# Patient Record
Sex: Male | Born: 1985 | Race: Black or African American | Hispanic: No | Marital: Single | Smoking: Former smoker
Health system: Southern US, Community
[De-identification: ages and names within clinical notes are randomized; demographics above are authoritative.]

## PROBLEM LIST (undated history)

## (undated) DIAGNOSIS — F419 Anxiety disorder, unspecified: Secondary | ICD-10-CM

## (undated) DIAGNOSIS — I10 Essential (primary) hypertension: Secondary | ICD-10-CM

## (undated) DIAGNOSIS — T7840XA Allergy, unspecified, initial encounter: Secondary | ICD-10-CM

## (undated) DIAGNOSIS — J45909 Unspecified asthma, uncomplicated: Secondary | ICD-10-CM

## (undated) DIAGNOSIS — K219 Gastro-esophageal reflux disease without esophagitis: Secondary | ICD-10-CM

## (undated) DIAGNOSIS — R011 Cardiac murmur, unspecified: Secondary | ICD-10-CM

## (undated) HISTORY — DX: Gastro-esophageal reflux disease without esophagitis: K21.9

## (undated) HISTORY — DX: Unspecified asthma, uncomplicated: J45.909

## (undated) HISTORY — DX: Anxiety disorder, unspecified: F41.9

## (undated) HISTORY — DX: Essential (primary) hypertension: I10

## (undated) HISTORY — DX: Allergy, unspecified, initial encounter: T78.40XA

## (undated) HISTORY — DX: Cardiac murmur, unspecified: R01.1

---

## 2021-11-04 ENCOUNTER — Emergency Department: Payer: Self-pay

## 2021-11-04 ENCOUNTER — Encounter: Payer: Self-pay | Admitting: Emergency Medicine

## 2021-11-04 ENCOUNTER — Emergency Department
Admission: EM | Admit: 2021-11-04 | Discharge: 2021-11-04 | Disposition: A | Discharge status: Home / Self Care | Payer: Self-pay | Attending: Emergency Medicine | Admitting: Emergency Medicine

## 2021-11-04 ENCOUNTER — Other Ambulatory Visit: Payer: Self-pay

## 2021-11-04 DIAGNOSIS — R1110 Vomiting, unspecified: Secondary | ICD-10-CM | POA: Insufficient documentation

## 2021-11-04 DIAGNOSIS — R079 Chest pain, unspecified: Secondary | ICD-10-CM | POA: Insufficient documentation

## 2021-11-04 DIAGNOSIS — R1115 Cyclical vomiting syndrome unrelated to migraine: Secondary | ICD-10-CM

## 2021-11-04 LAB — BASIC METABOLIC PANEL
Anion gap: 7 (ref 5–15)
BUN: 15 mg/dL (ref 6–20)
CO2: 25 mmol/L (ref 22–32)
Calcium: 9.2 mg/dL (ref 8.9–10.3)
Chloride: 104 mmol/L (ref 98–111)
Creatinine, Ser: 1.1 mg/dL (ref 0.61–1.24)
GFR, Estimated: >60 mL/min (ref >60)
Glucose, Bld: 114 mg/dL — ABNORMAL HIGH (ref 70–99)
Potassium: 3.9 mmol/L (ref 3.5–5.1)
Sodium: 136 mmol/L (ref 135–145)

## 2021-11-04 LAB — CBC
HCT: 46 % (ref 39.0–52.0)
Hemoglobin: 15.2 g/dL (ref 13.0–17.0)
MCH: 29.9 pg (ref 26.0–34.0)
MCHC: 33 g/dL (ref 30.0–36.0)
MCV: 90.6 fL (ref 80.0–100.0)
Platelets: 283 10*3/uL (ref 150–400)
RBC: 5.08 MIL/uL (ref 4.22–5.81)
RDW: 13.4 % (ref 11.5–15.5)
WBC: 8.4 10*3/uL (ref 4.0–10.5)
nRBC: 0 % (ref 0.0–0.2)

## 2021-11-04 LAB — TROPONIN I (HIGH SENSITIVITY): Troponin I (High Sensitivity): 6 ng/L (ref <18)

## 2021-11-04 MED ORDER — ONDANSETRON 4 MG PO TBDP: 4.0000 mg | ORAL_TABLET | Freq: Three times a day (TID) | ORAL | 0 refills | Status: DC | PRN

## 2021-11-04 NOTE — Discharge Instructions (Addendum)
-  Please establish with a primary care provider in regards to the incidental finding on your chest x-ray, as well as for ongoing blood pressure management. ?-Follow-up with a gastroenterologist listed above, as discussed. ?-Return to the emergency department anytime if you begin to experience any new or worsening symptoms. ?

## 2021-11-04 NOTE — ED Provider Notes (Signed)
? ?[facility] ?Provider Note ? ? ? Event Date/Time  ? First MD Initiated Contact with Patient 11/04/21 1537   ?  (approximate) ? ? ?History  ? ?Chief Complaint ?Chest Pain ? ? ?HPI ?Zachary Golden is a 36 y.o. male, no remarkable medical history, presents to the emergency department for evaluation of chest pain.  Patient states that he has a 2 to 3-year history of having weekly episodes of emesis that occurs as soon as he wakes up.  Following these episodes of emesis, he typically has residual chest pain afterwards.  He states that there is nothing new that happened today, however he is convinced by his partner to come get evaluated in the emergency department for this.  He has not previously been evaluated for this.  Denies fever/chills, abdominal pain, flank pain, diarrhea, urinary symptoms, lightheadedness/dizziness, or rashes/lesions. ? ?History Limitations: No limitations ? ?    ? ? ?Physical Exam  ?Triage Vital Signs: ?ED Triage Vitals  ?Enc Vitals Group  ?   BP 11/04/21 1324 (!) 177/131  ?   Pulse Rate 11/04/21 1324 73  ?   Resp 11/04/21 1324 18  ?   Temp 11/04/21 1324 98.1 ?F (36.7 ?C)  ?   Temp Source 11/04/21 1324 Oral  ?   SpO2 11/04/21 1324 95 %  ?   Weight 11/04/21 1323 245 lb (111.1 kg)  ?   Height 11/04/21 1323 5\' 9"  (1.753 m)  ?   Head Circumference --   ?   Peak Flow --   ?   Pain Score 11/04/21 1323 8  ?   Pain Loc --   ?   Pain Edu? --   ?   Excl. in [place]? --   ? ? ?Most recent vital signs: ?Vitals:  ? 11/04/21 1556 11/04/21 1615  ?BP: (!) 168/118 (!) 160/100  ?Pulse: 70 70  ?Resp: 18 18  ?Temp:    ?SpO2: 96% 97%  ? ? ?General: Awake, NAD.  ?Skin: Warm, dry. No rashes or lesions.  ?Eyes: PERRL. Conjunctivae normal.  ?Neck: Normal ROM. No nuchal rigidity.  ?CV: Good peripheral perfusion.  ?Resp: Normal effort.  Lung sounds are clear bilaterally. ?Abd: Soft, non-tender. No distention.  ?Neuro: At baseline. No gross neurological deficits.  ?MSK: No gross deformities. Normal ROM in  all extremities.  ? ? ?Physical Exam ? ? ? ?ED Results / Procedures / Treatments  ?Labs ?(all labs ordered are listed, but only abnormal results are displayed) ?Labs Reviewed  ?BASIC METABOLIC PANEL - Abnormal; Notable for the following components:  ?    Result Value  ? Glucose, Bld 114 (*)   ? All other components within normal limits  ?CBC  ?TROPONIN I (HIGH SENSITIVITY)  ? ? ? ?EKG ?Sinus rhythm, rate of 74, nonspecific ST segment changes, no axis deviation, no AV blocks, normal QRS interval. ? ? ?RADIOLOGY ? ?ED Provider Interpretation: I personally reviewed this chest x-ray, small density appreciated in the right midlung patient my interpretation ? ?DG Chest 2 View ? ?Result Date: 11/04/2021 ?CLINICAL DATA:  Left-sided chest pain and nausea. EXAM: CHEST - 2 VIEW COMPARISON:  None. FINDINGS: The heart size and mediastinal contours are within normal limits. Normal pulmonary vascularity. 1.4 cm nodular density in the peripheral right mid lung. No focal consolidation, pleural effusion, or pneumothorax. No acute osseous abnormality. IMPRESSION: 1.  No active cardiopulmonary disease. 2. 1.4 cm nodular density in the peripheral right mid lung. Recommend further evaluation with outpatient non-emergent  chest CT without contrast. Electronically Signed   By: Titus Dubin M.D.   On: 11/04/2021 13:51   ? ?PROCEDURES: ? ?Critical Care performed: None. ? ?Procedures ? ? ? ?MEDICATIONS ORDERED IN ED: ?Medications - No data to display ? ? ?IMPRESSION / MDM / ASSESSMENT AND PLAN / ED COURSE  ?I reviewed the triage vital signs and the nursing notes. ?             ?               ? ?Differential diagnosis includes, but is not limited to, ACS, myocarditis/pericarditis, gastroenteritis, hypertension, esophagitis. ? ?ED Course ?Patient appears well.  Notably hypertensive at 160/100, otherwise normal vitals.  NAD.  Not requesting any pain medication at this time. ? ?CBC shows no leukocytosis or anemia.  BMP shows no electrolyte  abnormalities or kidney injury. ? ?Initial EKG unremarkable.  Initial troponin 6.  Unlikely ACS or myocarditis/pericarditis. ? ?Assessment/Plan ?Patient presents with chest pain that occurs following episodes of emesis that he has been having chronically for the past 2 to 3 years.  He has not previously been evaluated for this.  Lab work-up reassuring for no evidence of cardiac pathology.  Chest x-ray is clear, though does note a nodular density in the right midlung.  Notified the patient of this finding and advised him that he will need repeat imaging may be scheduled through his primary care provider.  Additionally spoke to him about his high blood pressure, which additionally needs to be managed through his PCP.  In regards to his weekly episodes of emesis, unclear etiology at this time.  We will provide him a referral to gastroenterology for further evaluation.  We will provide her with a prescription for ondansetron.  We will plan to discharge. ? ?Considered admission for this patient, but given the patient's stable presentation, and unremarkable work-up, he is unlikely to benefit. ? ?Provided the patient with anticipatory guidance, return precautions, and educational material. Encouraged the patient to return to the emergency department at any time if they begin to experience any new or worsening symptoms. Patient expressed understanding and agreed with the plan.  ? ?  ? ? ?FINAL CLINICAL IMPRESSION(S) / ED DIAGNOSES  ? ?Final diagnoses:  ?Nonspecific chest pain  ?Emesis, persistent  ? ? ? ?Rx / DC Orders  ? ?ED Discharge Orders   ? ?      Ordered  ?  ondansetron (ZOFRAN-ODT) 4 MG disintegrating tablet  Every 8 hours PRN,   Status:  Discontinued       ? 11/04/21 1547  ?  ondansetron (ZOFRAN-ODT) 4 MG disintegrating tablet  Every 8 hours PRN       ? 11/04/21 1611  ? ?  ?  ? ?  ? ? ? ?Note:  This document was prepared using Dragon voice recognition software and may include unintentional dictation errors. ?   ?Teodoro Spray, [place] ?11/04/21 2354 ? ?  ?Lavonia Drafts, MD ?11/10/21 [phone] ? ?

## 2021-11-04 NOTE — ED Triage Notes (Signed)
Pt here with cp that started at 0630. Pt states pain is left sided and radiates to his neck. Pt denies diarrhea but has nausea and 3 episodes of emesis. Pt is here from [facility]. ?

## 2023-04-06 IMAGING — CR DG CHEST 2V
1 series · 2 of 2 positions shown · non-contrast
Comparison: None.

CLINICAL DATA: Left-sided chest pain and nausea.

EXAM:
CHEST - 2 VIEW

[Series 1: dg chest 2 view · 0.14mm/px · 2 of 2 slices shown]
[im 1/2]
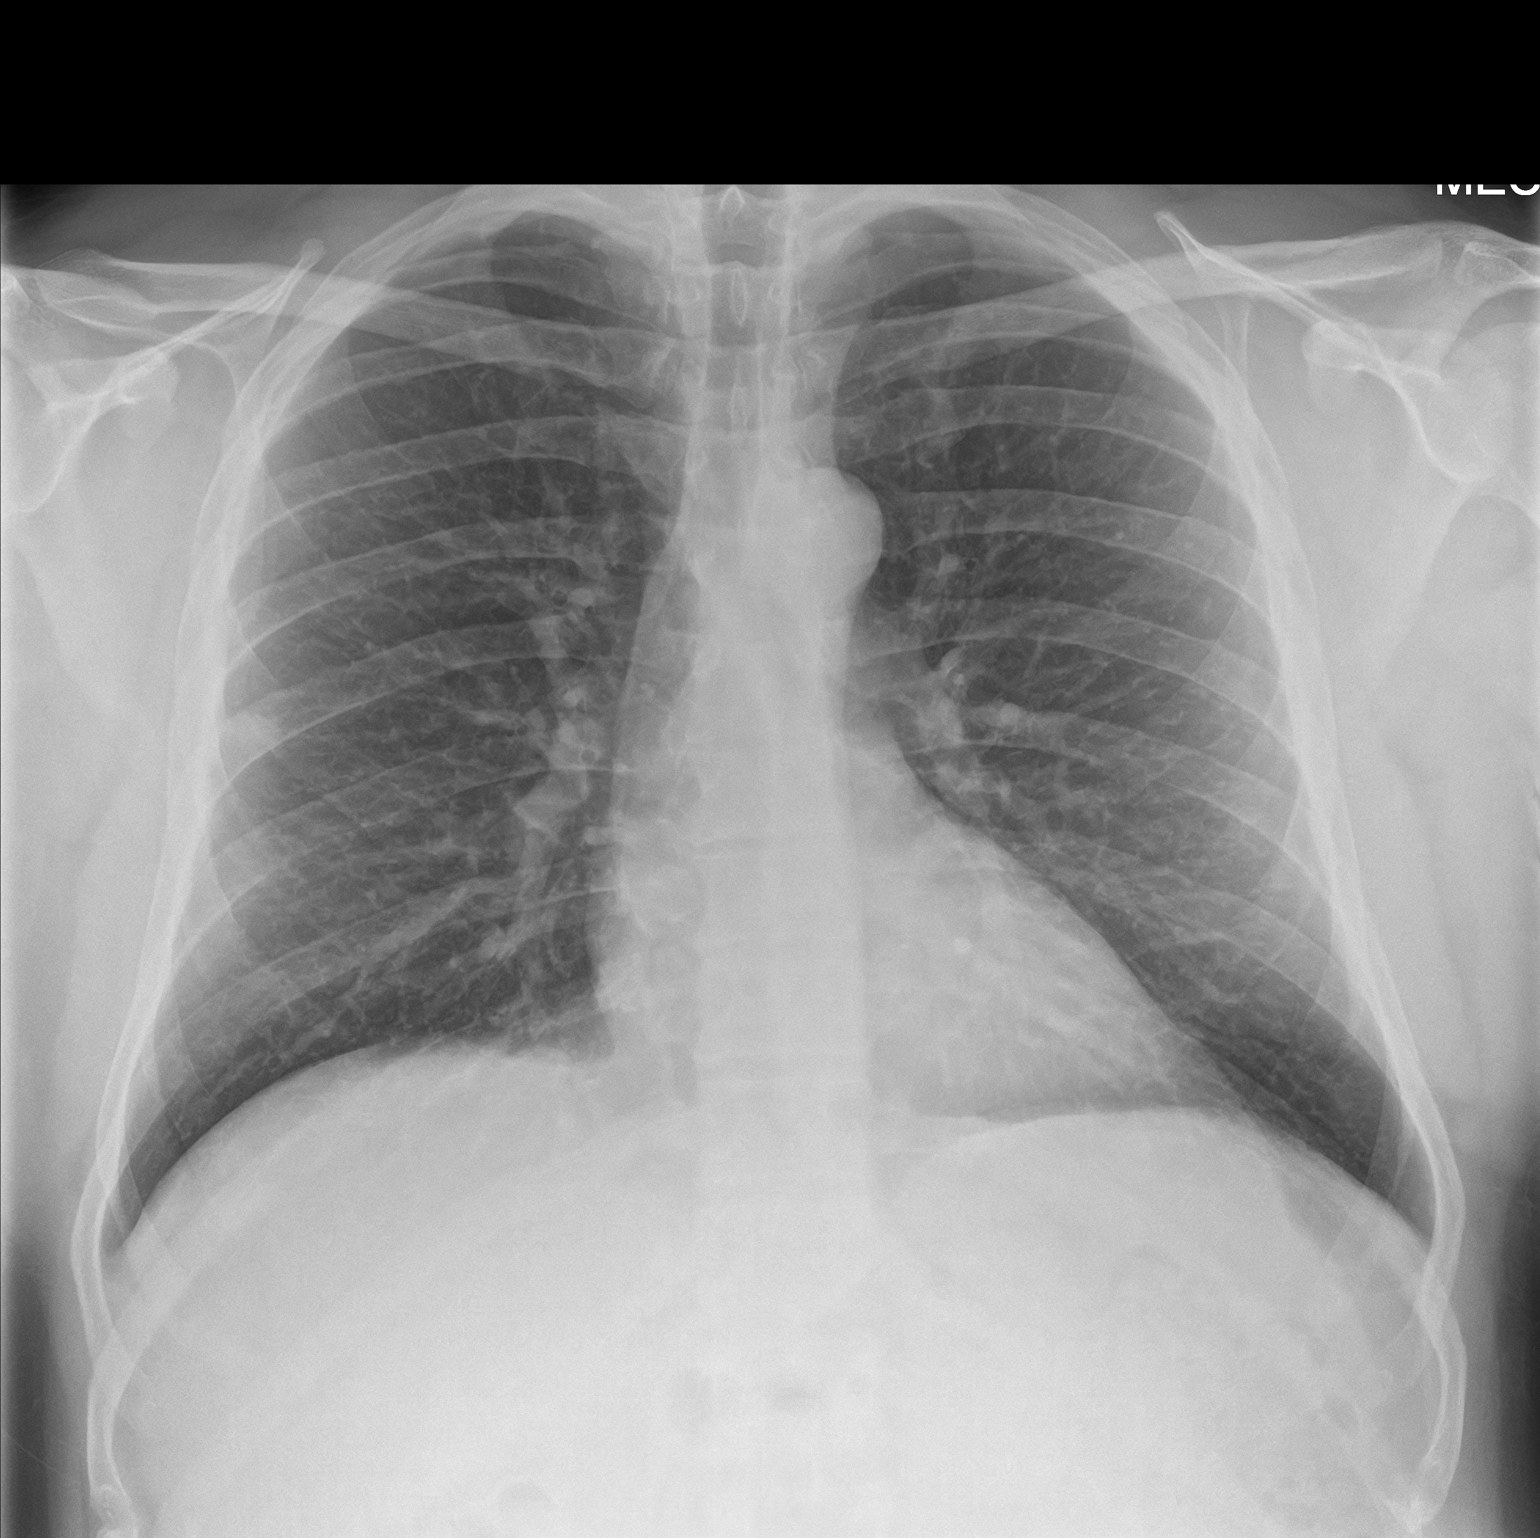
[im 2/2]
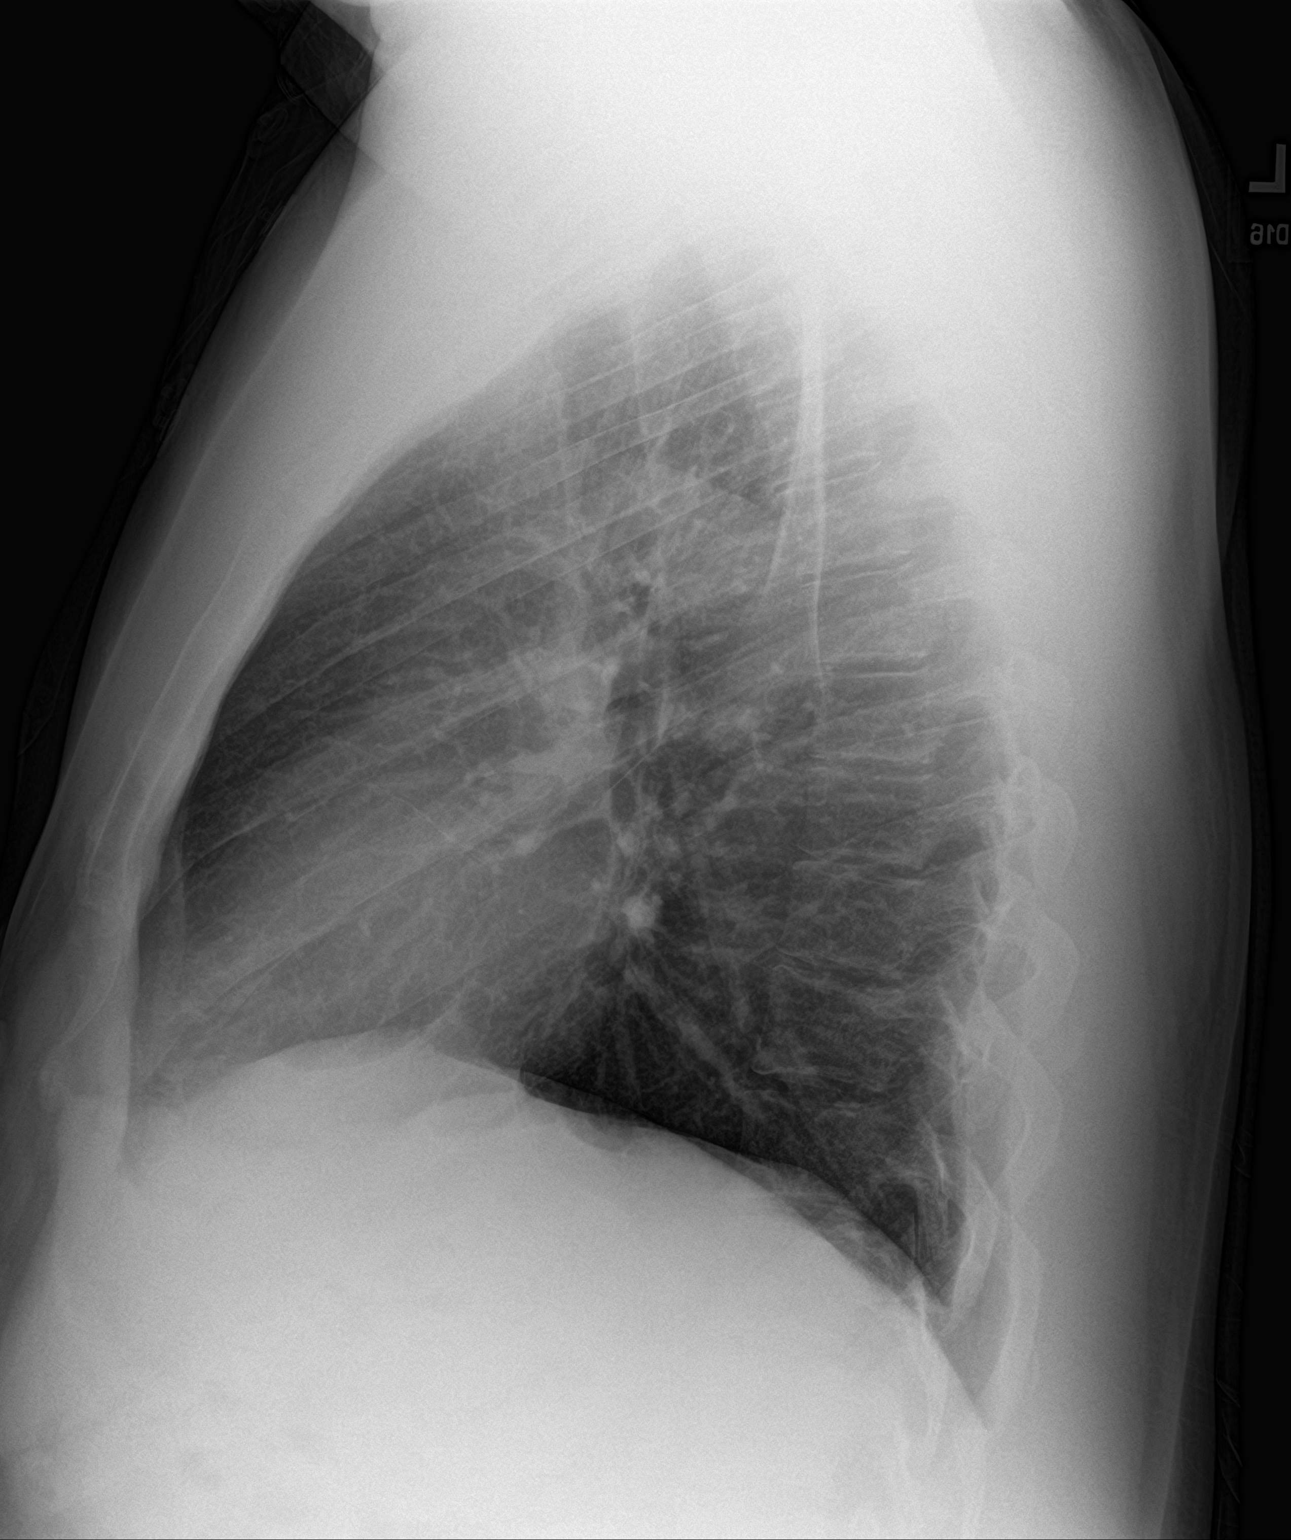

[2 of 2 positions shown; findings below may reference images not displayed]

FINDINGS: The heart size and mediastinal contours are within normal limits.
Normal pulmonary vascularity. 1.4 cm nodular density in the
peripheral right mid lung. No focal consolidation, pleural effusion,
or pneumothorax. No acute osseous abnormality.
IMPRESSION: 1.  No active cardiopulmonary disease.
2. 1.4 cm nodular density in the peripheral right mid lung.
Recommend further evaluation with outpatient non-emergent chest CT
without contrast.

## 2023-10-04 ENCOUNTER — Ambulatory Visit: Payer: Self-pay | Admitting: Family Medicine

## 2023-10-27 ENCOUNTER — Encounter: Payer: Self-pay | Admitting: Family Medicine

## 2023-10-27 ENCOUNTER — Ambulatory Visit (INDEPENDENT_AMBULATORY_CARE_PROVIDER_SITE_OTHER): Payer: Medicaid Other | Admitting: Family Medicine

## 2023-10-27 VITALS — BP 154/116 | HR 76 | Ht 69.0 in | Wt 262.0 lb

## 2023-10-27 DIAGNOSIS — J301 Allergic rhinitis due to pollen: Secondary | ICD-10-CM | POA: Diagnosis not present

## 2023-10-27 DIAGNOSIS — J45909 Unspecified asthma, uncomplicated: Secondary | ICD-10-CM

## 2023-10-27 DIAGNOSIS — Z114 Encounter for screening for human immunodeficiency virus [HIV]: Secondary | ICD-10-CM

## 2023-10-27 DIAGNOSIS — Z23 Encounter for immunization: Secondary | ICD-10-CM

## 2023-10-27 DIAGNOSIS — I10 Essential (primary) hypertension: Secondary | ICD-10-CM

## 2023-10-27 DIAGNOSIS — J45990 Exercise induced bronchospasm: Secondary | ICD-10-CM

## 2023-10-27 DIAGNOSIS — Z6838 Body mass index (BMI) 38.0-38.9, adult: Secondary | ICD-10-CM

## 2023-10-27 DIAGNOSIS — Z1159 Encounter for screening for other viral diseases: Secondary | ICD-10-CM

## 2023-10-27 DIAGNOSIS — Z130 Encounter for screening for diseases of the blood and blood-forming organs and certain disorders involving the immune mechanism: Secondary | ICD-10-CM

## 2023-10-27 DIAGNOSIS — Z13220 Encounter for screening for lipoid disorders: Secondary | ICD-10-CM

## 2023-10-27 DIAGNOSIS — R911 Solitary pulmonary nodule: Secondary | ICD-10-CM | POA: Diagnosis not present

## 2023-10-27 DIAGNOSIS — Z7689 Persons encountering health services in other specified circumstances: Secondary | ICD-10-CM

## 2023-10-27 MED ORDER — LOSARTAN POTASSIUM 25 MG PO TABS: 25.0000 mg | ORAL_TABLET | Freq: Every day | ORAL | 0 refills | Status: DC

## 2023-10-27 MED ORDER — HYDROCHLOROTHIAZIDE 12.5 MG PO CAPS: 12.5000 mg | ORAL_CAPSULE | Freq: Every day | ORAL | 0 refills | Status: DC

## 2023-10-27 NOTE — Progress Notes (Signed)
 New patient visit   Patient: Zachary Golden   DOB: 1986/06/10   37 y.o. Male  MRN: [id number] Visit Date: 10/27/2023  Today's healthcare provider: Sherlyn Hay, DO   Chief Complaint  Patient presents with   New Patient (Initial Visit)    Patient is present to establish care with a provider due to morning sickness after several lifestyle changes for a few years now.  Labs last completed last year.    Care Management    Pneumococcal Vaccine  HIV Screening  Hepatitis C Screening    Subjective    Zachary Golden is a 38 y.o. male who presents today as a new patient to establish care.  HPI HPI     New Patient (Initial Visit)    Additional comments: Patient is present to establish care with a provider due to morning sickness after several lifestyle changes for a few years now.  Labs last completed last year.         Care Management    Additional comments: Pneumococcal Vaccine  HIV Screening  Hepatitis C Screening       Last edited by Acey Lav, CMA on 10/27/2023  1:57 PM.      Zachary Golden is a 38 year old male with hypertension who presents with concerns about high blood pressure management.  He has a history of consistently high blood pressure readings, which he believes may have worsened with weight gain after the age of 80. He has attempted lifestyle modifications such as reducing salt intake. He recalls an ER visit in 2023 due to high blood pressure, where a chest x-ray revealed a lung nodule.  Follow-up imaging was recommended but he reports he has not been able to do this.  He has not been on regular antihypertensive medication and is interested in exploring treatment options.  He has a history of asthma, previously managed with Advair and a 'little square pill' during high school. Currently, he uses albuterol as a rescue inhaler but has not needed regular medication for asthma since high school. No current shortness of breath, and he attributes strong lung function to  his past singing activities.  He reports seasonal allergies, particularly to pollen, which [place] He takes Zyrtec daily, which helps but causes dryness. He has tried Claritin in the past and is considering switching due to the side effects of Zyrtec.  He experiences occasional pain, described as shooting up his arm, side, or inner thigh, and sometimes cramps in his chest area. No current pain or swelling in his legs.  He has a history of ear issues, with heavy wax buildup affecting his singing ability. He has not had his ears flushed since he was 16 and reports pressure and discomfort when trying to sing at higher pitches.  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 He exercises minimally, performing basic exercises like crunches, squats, and pushups, and uses VR for arm and leg workouts. He has reduced alcohol consumption significantly since he stopped performing regularly.    Past Medical History:  Diagnosis Date   Allergy 2018   Avocado (undiagnosed)   Anxiety 2017?   I'm not entirely sure when it started but over the passed ten years it's only grown.   Asthma 2003   Sports induced   GERD (gastroesophageal reflux disease)    Heart murmur 1989   From birth to the age of [age]   Hypertension  2020   History reviewed. No pertinent surgical history. Family Status  Relation Name Status   Mother Geographical information systems officer   Father  (Not Specified)   Brother  (Not Specified)  No partnership data on file   Family History  Problem Relation Age of Onset   Diabetes type II Mother 97   Sleep apnea Father    Asthma Father    Sleep apnea Brother    Social History   Socioeconomic History   Marital status: Single    Spouse name: Not on file   Number of children: Not on file   Years of education: Not on file   Highest education level: GED or equivalent  Occupational History   Not on file   Tobacco Use   Smoking status: Former    Current packs/day: 0.00    Average packs/day: 0.5 packs/day for 10.0 years (5.0 ttl pk-yrs)    Types: Cigarettes    Quit date: 08/27/2004    Years since quitting: 19.1   Smokeless tobacco: Never   Tobacco comments:    I quit cold [place] the day i started losing my voice. Haven't had another.  Substance and Sexual Activity   Alcohol use: Not Currently    Comment: I used to drink alot. Now i hardly drink at all.   Drug use: Yes    Frequency: 7.0 times per week    Types: Marijuana    Comment: Once at the end of the day   Sexual activity: Yes    Birth control/protection: Condom  Other Topics Concern   Not on file  Social History Narrative   Not on file   Social Drivers of Health   Financial Resource Strain: Medium Risk (10/25/2023)   Overall Financial Resource Strain (CARDIA)    Difficulty of Paying Living Expenses: Somewhat hard  Food Insecurity: Food Insecurity Present (10/25/2023)   Hunger Vital Sign    Worried About Running Out of Food in the Last Year: Sometimes true    Ran Out of Food in the Last Year: Sometimes true  Transportation Needs: No Transportation Needs (10/25/2023)   PRAPARE - Administrator, Civil Service (Medical): No    Lack of Transportation (Non-Medical): No  Physical Activity: Sufficiently Active (10/25/2023)   Exercise Vital Sign    Days of Exercise per Week: 4 days    Minutes of Exercise per Session: 60 min  Stress: Stress Concern Present (10/25/2023)   [employer] of Occupational Health - Occupational Stress Questionnaire    Feeling of Stress : Very much  Social Connections: Socially Isolated (10/25/2023)   Social Connection and Isolation Panel [NHANES]    Frequency of Communication with Friends and Family: More than three times a week    Frequency of Social Gatherings with Friends and Family: More than three times a week    Attends Religious Services: Never    Database administrator or  Organizations: No    Attends Engineer, structural: Not on file    Marital Status: Never married   Outpatient Medications Prior to Visit  Medication Sig   albuterol (VENTOLIN HFA) 108 (90 Base) MCG/ACT inhaler Inhale 2 puffs into the lungs every 6 (six) hours as needed for wheezing or shortness of breath.   [DISCONTINUED] ondansetron (ZOFRAN-ODT) 4 MG disintegrating tablet Take 1 tablet (4 mg total) by mouth every 8 (eight) hours as needed for nausea or vomiting.   No facility-administered medications prior to visit.   Allergies  Allergen Reactions  Avocado Itching    Immunization History  Administered Date(s) Administered   PNEUMOCOCCAL CONJUGATE-20 10/27/2023   Tdap 10/27/2023    Health Maintenance  Topic Date Due   COVID-19 Vaccine (1 - 2024-25 season) 04/26/2024 (Originally 03/28/2023)   INFLUENZA VACCINE  02/25/2024   DTaP/Tdap/Td (2 - Td or Tdap) 10/26/2033   Pneumococcal Vaccine 55-11 Years old  Completed   Hepatitis C Screening  Completed   HIV Screening  Completed   HPV VACCINES  Aged Out    Patient Care Team: Lisandra Mathisen, Monico Blitz, DO as PCP - General (Family Medicine)       Objective    BP (!) 154/116 (BP Location: Right Arm, Patient Position: Sitting, Cuff Size: Large)   Pulse 76   Ht 5\' 9"  (1.753 m)   Wt 262 lb (118.8 kg)   SpO2 98%   BMI 38.69 kg/m     Physical Exam Vitals and nursing note reviewed.  Constitutional:      General: He is not in acute distress.    Appearance: Normal appearance.  HENT:     Head: Normocephalic and atraumatic.  Eyes:     General: No scleral icterus.    Conjunctiva/sclera: Conjunctivae normal.  Cardiovascular:     Rate and Rhythm: Normal rate.  Pulmonary:     Effort: Pulmonary effort is normal.  Neurological:     Mental Status: He is alert and oriented to person, place, and time. Mental status is at baseline.  Psychiatric:        Mood and Affect: Mood normal.        Behavior: Behavior normal.      Depression Screen    10/27/2023    2:01 PM  PHQ 2/9 Scores  PHQ - 2 Score 3  PHQ- 9 Score 15   Results for orders placed or performed in visit on 10/27/23  Comprehensive metabolic panel with GFR  Result Value Ref Range   Glucose 104 (H) 70 - 99 mg/dL   BUN 15 6 - 20 mg/dL   Creatinine, Ser 6.96 (H) 0.76 - 1.27 mg/dL   eGFR 68 >29 BM/WUX/3.24   BUN/Creatinine Ratio 11 9 - 20   Sodium 140 134 - 144 mmol/L   Potassium 4.8 3.5 - 5.2 mmol/L   Chloride 103 96 - 106 mmol/L   CO2 21 20 - 29 mmol/L   Calcium 9.7 8.7 - 10.2 mg/dL   Total Protein 7.4 6.0 - 8.5 g/dL   Albumin 4.7 4.1 - 5.1 g/dL   Globulin, Total 2.7 1.5 - 4.5 g/dL   Bilirubin Total 0.4 0.0 - 1.2 mg/dL   Alkaline Phosphatase 106 44 - 121 IU/L   AST 28 0 - 40 IU/L   ALT 45 (H) 0 - 44 IU/L  Hemoglobin A1c  Result Value Ref Range   Hgb A1c MFr Bld 6.1 (H) 4.8 - 5.6 %   Est. average glucose Bld gHb Est-mCnc 128 mg/dL  Lipid panel  Result Value Ref Range   Cholesterol, Total 220 (H) 100 - 199 mg/dL   Triglycerides 94 0 - 149 mg/dL   HDL 41 >40 mg/dL   VLDL Cholesterol Cal 17 5 - 40 mg/dL   LDL Chol Calc (NIH) 102 (H) 0 - 99 mg/dL   Chol/HDL Ratio 5.4 (H) 0.0 - 5.0 ratio  TSH Rfx on Abnormal to Free T4  Result Value Ref Range   TSH 0.694 0.450 - 4.500 uIU/mL  HIV Antibody (routine testing w rflx)  Result Value Ref Range  HIV Screen 4th Generation wRfx Non Reactive Non Reactive  HCV Ab w Reflex to Quant PCR  Result Value Ref Range   HCV Ab Non Reactive Non Reactive  Interpretation:  Result Value Ref Range   HCV Interp 1: Comment     Assessment & Plan     Establishing care with new doctor, encounter for  Hypertension, unspecified type -     hydroCHLOROthiazide; Take 1 capsule (12.5 mg total) by mouth daily. Start first.  Dispense: 30 capsule; Refill: 0 -     Losartan Potassium; Take 1 tablet (25 mg total) by mouth daily. Start after taking Hydrochlorothiazide for 1.5 weeks  Dispense: 30 tablet; Refill:  0  Solitary pulmonary nodule -     CT CHEST WO CONTRAST; Future  Seasonal allergic rhinitis due to pollen  Exercise-induced asthma -     Pneumococcal conjugate vaccine 20-valent  Encounter for Prevnar pneumococcal vaccination  Morbid obesity (HCC) -     Hemoglobin A1c  BMI 38.0-38.9,adult -     Hemoglobin A1c  Need for Tdap vaccination -     Pneumococcal conjugate vaccine 20-valent -     Tdap vaccine greater than or equal to 7yo IM  Need for pneumococcal vaccine -     Pneumococcal conjugate vaccine 20-valent  Encounter for screening for HIV -     HIV Antibody (routine testing w rflx)  Encounter for hepatitis C screening test for low risk patient -     HCV Ab w Reflex to Quant PCR -     Interpretation:  Screening for endocrine, metabolic and immunity disorder -     Comprehensive metabolic panel with GFR -     Hemoglobin A1c -     TSH Rfx on Abnormal to Free T4  Screening for lipid disorders -     Lipid panel    Establishing care with a new doctor, encounter for No vaccinations in last ten years. Discussed benefits of tetanus and COVID-19 vaccines. Agreed to receive tetanus and pneumonia vaccines today. - Administer tetanus vaccine today. - Administer pneumonia vaccine today. - Discuss COVID-19 vaccination and consider administration.  Hypertension, unspecified Chronic hypertension with consistently elevated readings. Lifestyle modifications insufficient. Pharmacotherapy initiation recommended to prevent complications.  Potential concern exists for secondary hypertension given what appears to be longstanding nature of the problem. - Start hydrochlorothiazide, followed by losartan after 1.5 weeks. - Monitor blood pressure at home regularly. - Schedule follow-up in 3 weeks to assess effectiveness of medications. - Order blood work: TSH, cholesterol, A1c.  Lung Nodule Lung nodule identified on chest x-ray requires further evaluation. CT scan planned to assess status  and exclude serious pathology. - Order CT scan of the chest.  Seasonal allergic Rhinitis Seasonal allergies managed with Zyrtec. Reports dryness as a side effect. Alternatives suggested with nighttime dosing to reduce side effects. - Consider switching to Claritin or Allegra. - Take allergy medication at night.  Ear Wax Buildup Ear wax buildup affecting singing ability. Debrox recommended to soften wax for removal. - Use Debrox for ear wax softening 3-4 days before next appointment to allow for more effective ear irrigation.   Return in about 3 weeks (around 11/17/2023) for HTN, ADHD eval, ear irrigation.     I discussed the assessment and treatment plan with the patient  The patient was provided an opportunity to ask questions and all were answered. The patient agreed with the plan and demonstrated an understanding of the instructions.   The patient was advised to call  back or seek an in-person evaluation if the symptoms worsen or if the condition fails to improve as anticipated.    Sherlyn Hay, DO  [facility], [place] [phone] (phone) [phone] (fax)  [facility]

## 2023-10-27 NOTE — Patient Instructions (Addendum)
 You can get Debrox at the pharmacy. Start using it, following the instructions on it, 3-4 days prior to the next appointment to plan on having ears flushed.  ___________________________  Check your blood pressure once daily, and any time you have concerning symptoms like headache, chest pain, dizziness, shortness of breath, or vision changes.   Our goal is less than 130/80.  To appropriately check your blood pressure, make sure you do the following:  1) Avoid caffeine, exercise, or tobacco products for 30 minutes before checking. Empty your bladder. 2) Sit with your back supported in a flat-backed chair. Rest your arm on something flat (arm of the chair, table, etc). 3) Sit still with your feet flat on the floor, resting, for at least 5 minutes.  4) Check your blood pressure. Take 1-2 readings.  5) Write down these readings and bring with you to any provider appointments.  Bring your home blood pressure machine with you to a provider's office for accuracy comparison at least once a year.   Make sure you take your blood pressure medications before you come to any office visit, even if you were asked to fast for labs.    ___________________________________________________    Increase your intake of fresh/frozen fruits and vegetables.  The Mediterranean diet is a great reference for meal ideas.  I strongly encourage you to incorporate exercise into your daily routine (at least 30 minutes most days of the week) and monitor your dietary intake. - I encourage you to measure/weigh your foods/beverages for a few days to get a more accurate idea of what different amounts of things look like on your plate or in your glass.  - Using smaller plates/glasses also helps in that it tricks our minds into thinking we've eaten more than we have. Studies have shown that we eat more when presented with larger plates, even with the same amount of food on them.   - Plan to eat until you are no longer hungry,  rather than until you're full.  If you don't normally exercise, start with something simple or more enjoyable. You can plan to walk for your half hour or do something like dancing if you enjoy it.  Build up your activity over time; this will make it more enjoyable and reduce your risk of injury.  Here are some videos which offer a variety of different workout classes with different levels: https://[email or web address].com/session/203  It is completely free. If a full video is too much for you to do, you can do them in bite-sized chunks (just pausing when needed and resuming later in the day).   Even if these actions don't ultimately lead to weight loss, increasing your activity will still help to reduce your insulin resistance and will improve your cardiovascular health (making heart attack/stroke less likely as you age).

## 2023-10-28 LAB — COMPREHENSIVE METABOLIC PANEL WITH GFR
ALT: 45 IU/L — ABNORMAL HIGH (ref 0–44)
AST: 28 IU/L (ref 0–40)
Albumin: 4.7 g/dL (ref 4.1–5.1)
Alkaline Phosphatase: 106 IU/L (ref 44–121)
BUN/Creatinine Ratio: 11 (ref 9–20)
BUN: 15 mg/dL (ref 6–20)
Bilirubin Total: 0.4 mg/dL (ref 0.0–1.2)
CO2: 21 mmol/L (ref 20–29)
Calcium: 9.7 mg/dL (ref 8.7–10.2)
Chloride: 103 mmol/L (ref 96–106)
Creatinine, Ser: 1.39 mg/dL — ABNORMAL HIGH (ref 0.76–1.27)
Globulin, Total: 2.7 g/dL (ref 1.5–4.5)
Glucose: 104 mg/dL — ABNORMAL HIGH (ref 70–99)
Potassium: 4.8 mmol/L (ref 3.5–5.2)
Sodium: 140 mmol/L (ref 134–144)
Total Protein: 7.4 g/dL (ref 6.0–8.5)
eGFR: 68 mL/min/{1.73_m2} (ref >59)

## 2023-10-28 LAB — LIPID PANEL
Chol/HDL Ratio: 5.4 ratio — ABNORMAL HIGH (ref 0.0–5.0)
Cholesterol, Total: 220 mg/dL — ABNORMAL HIGH (ref 100–199)
HDL: 41 mg/dL (ref >39)
LDL Chol Calc (NIH): 162 mg/dL — ABNORMAL HIGH (ref 0–99)
Triglycerides: 94 mg/dL (ref 0–149)
VLDL Cholesterol Cal: 17 mg/dL (ref 5–40)

## 2023-10-28 LAB — TSH RFX ON ABNORMAL TO FREE T4: TSH: 0.694 u[IU]/mL (ref 0.450–4.500)

## 2023-10-28 LAB — HEMOGLOBIN A1C
Est. average glucose Bld gHb Est-mCnc: 128 mg/dL
Hgb A1c MFr Bld: 6.1 % — ABNORMAL HIGH (ref 4.8–5.6)

## 2023-10-28 LAB — HCV INTERPRETATION

## 2023-10-28 LAB — HIV ANTIBODY (ROUTINE TESTING W REFLEX): HIV Screen 4th Generation wRfx: NONREACTIVE

## 2023-10-28 LAB — HCV AB W REFLEX TO QUANT PCR: HCV Ab: NONREACTIVE

## 2023-10-29 ENCOUNTER — Ambulatory Visit

## 2023-11-02 DIAGNOSIS — R911 Solitary pulmonary nodule: Secondary | ICD-10-CM | POA: Insufficient documentation

## 2023-11-02 DIAGNOSIS — Z6838 Body mass index (BMI) 38.0-38.9, adult: Secondary | ICD-10-CM | POA: Insufficient documentation

## 2023-11-02 DIAGNOSIS — Z7689 Persons encountering health services in other specified circumstances: Secondary | ICD-10-CM | POA: Insufficient documentation

## 2023-11-02 DIAGNOSIS — J301 Allergic rhinitis due to pollen: Secondary | ICD-10-CM | POA: Insufficient documentation

## 2023-11-03 ENCOUNTER — Encounter: Payer: Self-pay | Admitting: Family Medicine

## 2023-11-05 ENCOUNTER — Ambulatory Visit

## 2023-11-17 ENCOUNTER — Encounter: Payer: Self-pay | Admitting: Family Medicine

## 2023-11-17 ENCOUNTER — Ambulatory Visit: Admitting: Family Medicine

## 2023-11-17 ENCOUNTER — Ambulatory Visit (INDEPENDENT_AMBULATORY_CARE_PROVIDER_SITE_OTHER): Admitting: Family Medicine

## 2023-11-17 VITALS — BP 145/87 | HR 69 | Resp 16 | Ht 69.0 in | Wt 260.3 lb

## 2023-11-17 DIAGNOSIS — F988 Other specified behavioral and emotional disorders with onset usually occurring in childhood and adolescence: Secondary | ICD-10-CM | POA: Diagnosis not present

## 2023-11-17 DIAGNOSIS — H6122 Impacted cerumen, left ear: Secondary | ICD-10-CM

## 2023-11-17 DIAGNOSIS — I10 Essential (primary) hypertension: Secondary | ICD-10-CM | POA: Diagnosis not present

## 2023-11-17 HISTORY — DX: Impacted cerumen, left ear: H61.22

## 2023-11-17 MED ORDER — LOSARTAN POTASSIUM 25 MG PO TABS: 25.0000 mg | ORAL_TABLET | Freq: Every day | ORAL | 0 refills | Status: DC

## 2023-11-17 MED ORDER — HYDROCHLOROTHIAZIDE 25 MG PO TABS: 25.0000 mg | ORAL_TABLET | Freq: Every day | ORAL | 3 refills | Status: DC

## 2023-11-17 MED ORDER — VENLAFAXINE HCL ER 37.5 MG PO CP24: 37.5000 mg | ORAL_CAPSULE | Freq: Every day | ORAL | 1 refills | Status: DC

## 2023-11-17 NOTE — Patient Instructions (Addendum)
 Take the hydrochlorothiazide  25 mg daily and losartan  25 mg daily for 2 weeks - If after 2 weeks, your blood pressure is still higher than 130/80, go ahead and start taking 2 tablets of losartan  (these may be taken together).  The total you will be taking at that point is losartan  50 mg daily and hydrochlorothiazide  25 mg daily.    Check your blood pressure once daily, and any time you have concerning symptoms like headache, chest pain, dizziness, shortness of breath, or vision changes.   Our goal is less than 130/80.  To appropriately check your blood pressure, make sure you do the following:  1) Avoid caffeine, exercise, or tobacco products for 30 minutes before checking. Empty your bladder. 2) Sit with your back supported in a flat-backed chair. Rest your arm on something flat (arm of the chair, table, etc). 3) Sit still with your feet flat on the floor, resting, for at least 5 minutes.  4) Check your blood pressure. Take 1-2 readings.  5) Write down these readings and bring with you to any provider appointments.  Bring your home blood pressure machine with you to a provider's office for accuracy comparison at least once a year.   Make sure you take your blood pressure medications before you come to any office visit, even if you were asked to fast for labs.Aaron Aas

## 2023-11-17 NOTE — Progress Notes (Signed)
 Established patient visit   Patient: Zachary Golden   DOB: May 12, 1986   37 y.o. Male  MRN: [id number] Visit Date: 11/17/2023  Today's healthcare provider: Carlean Charter, DO   Chief Complaint  Patient presents with   Medical Management of Chronic Issues     3 weeks follow-up for HTN, ADHD eval, ear irrigation.  Average BP's reading at home 146-150's/100-90's   Subjective    HPI Zachary Golden is a 38 year old male with hypertension who presents for blood pressure management.  He has been monitoring his blood pressure at home, noting some improvement, though readings remain elevated. Prior to starting a second medication, his blood pressure was around 146/99, 140/99, and 148/97. After initiating the second medication, his blood pressure was 154/101. He had been taking hydrochlorothiazide  for a week and a half and then added losartan  one week ago Sunday, without missing any doses.  He describes feeling rested in the mornings in general but does feel 'a little bit sicker' on the mornings he doesn't eat right away. He believes his body is adjusting to the medication and possibly signaling a need to eat in the morning.  He has a history of a positive depression screening, with symptoms including trouble falling asleep, staying asleep, or sleeping too much, and feeling tired with little energy. He attributes some of these feelings to difficulty finding work after an incident with [employer], where he previously worked. He reports that sleeping has become easier lately.  He recalls an incident five to six months ago where he experienced severe cramps and muscle clenching while working in high humidity, which was a new experience for him. He has since been cautious about working in such conditions and ensures adequate hydration with electrolyte drinks.  He mentions a family history of ADHD, as his father was diagnosed somewhat recently, which prompted him to take his own condition more  seriously.      Medications: Outpatient Medications Prior to Visit  Medication Sig   albuterol (VENTOLIN HFA) 108 (90 Base) MCG/ACT inhaler Inhale 2 puffs into the lungs every 6 (six) hours as needed for wheezing or shortness of breath.   [DISCONTINUED] hydrochlorothiazide  (MICROZIDE ) 12.5 MG capsule Take 1 capsule (12.5 mg total) by mouth daily. Start first.   [DISCONTINUED] losartan  (COZAAR ) 25 MG tablet Take 1 tablet (25 mg total) by mouth daily. Start after taking Hydrochlorothiazide  for 1.5 weeks   No facility-administered medications prior to visit.        Objective    BP (!) 145/87 (BP Location: Right Arm, Patient Position: Sitting, Cuff Size: Large)   Pulse 69   Resp 16   Ht 5\' 9"  (1.753 m)   Wt 260 lb 4.8 oz (118.1 kg)   SpO2 98%   BMI 38.44 kg/m     Physical Exam Vitals and nursing note reviewed.  Constitutional:      General: He is not in acute distress.    Appearance: Normal appearance.  HENT:     Head: Normocephalic and atraumatic.  Eyes:     General: No scleral icterus.    Conjunctiva/sclera: Conjunctivae normal.  Cardiovascular:     Rate and Rhythm: Normal rate.  Pulmonary:     Effort: Pulmonary effort is normal.  Neurological:     Mental Status: He is alert and oriented to person, place, and time. Mental status is at baseline.  Psychiatric:        Mood and Affect: Mood normal.  Behavior: Behavior normal.      No results found for any visits on 11/17/23.  Assessment & Plan    Hypertension, unspecified type -     hydroCHLOROthiazide ; Take 1 tablet (25 mg total) by mouth daily.  Dispense: 90 tablet; Refill: 3 -     Losartan  Potassium; Take 1 tablet (25 mg total) by mouth daily.  Dispense: 90 tablet; Refill: 0  Impacted cerumen of left ear  Attention deficit disorder (ADD) in adult -     Venlafaxine  HCl ER; Take 1 capsule (37.5 mg total) by mouth daily with breakfast.  Dispense: 30 capsule; Refill:  1      Hypertension Hypertension remains uncontrolled. Home readings improved but still elevated at 154/101 mmHg. Current regimen includes hydrochlorothiazide  and losartan . Morning malaise possibly due to delayed eating. - Increase hydrochlorothiazide  to 25 mg daily. - Instruct to increase losartan  to 50 mg daily if blood pressure remains above 130/80 mmHg after two weeks. - Educated on proper blood pressure measurement technique at home. - Sent prescription for losartan  25 mg daily, #90 tablets.  Attention Deficit Hyperactivity Disorder (ADHD) ADHD symptoms persist. Stimulants not recommended due to hypertension. Discussed non-stimulant options like venlafaxine  and atomoxetine. He is open to these options. - Consider venlafaxine  or atomoxetine for ADHD symptoms. - Sent prescription for 30 tablets of venlafaxine  with one refill.  Impacted cerumen of the left ear - Left ear irrigation performed today with successful removal of cerumen.   Return in about 6 weeks (around 12/29/2023) for HTN.      I discussed the assessment and treatment plan with the patient  The patient was provided an opportunity to ask questions and all were answered. The patient agreed with the plan and demonstrated an understanding of the instructions.   The patient was advised to call back or seek an in-person evaluation if the symptoms worsen or if the condition fails to improve as anticipated.    Carlean Charter, DO  [facility] [phone] (phone) [phone] (fax)  [facility]

## 2023-11-19 ENCOUNTER — Ambulatory Visit: Admitting: Family Medicine

## 2023-12-16 ENCOUNTER — Telehealth: Payer: Self-pay | Admitting: Family Medicine

## 2023-12-16 NOTE — Telephone Encounter (Unsigned)
 Copied from CRM [id number]. Topic: Clinical - Medication Refill >> Dec 16, 2023  5:54 PM Chrystal Crape R wrote: Medication: losartan  (COZAAR ) 25 MG  Has the patient contacted their pharmacy? No, advised h I'm to call pharmacy to check for refill (Agent: If no, request that the patient contact the pharmacy for the refill. If patient does not wish to contact the pharmacy document the reason why and proceed with request.) (Agent: If yes, when and what did the pharmacy advise?)  This is the patient's preferred pharmacy:   [facility] [address] [place] - [address] [place] [address] Phone: [phone] Fax: [phone]  Is this the correct pharmacy for this prescription? Yes If no, delete pharmacy and type the correct one.   Has the prescription been filled recently? No  Is the patient out of the medication? Yes  Has the patient been seen for an appointment in the last year OR does the patient have an upcoming appointment? Yes  Can we respond through MyChart? Yes  Agent: Please be advised that Rx refills may take up to 3 business days. We ask that you follow-up with your pharmacy.

## 2023-12-17 ENCOUNTER — Telehealth: Payer: Self-pay

## 2023-12-17 DIAGNOSIS — F988 Other specified behavioral and emotional disorders with onset usually occurring in childhood and adolescence: Secondary | ICD-10-CM

## 2023-12-17 NOTE — Telephone Encounter (Signed)
 Copied from CRM [id number]. Topic: Clinical - Medication Question >> Dec 16, 2023  6:00 PM Chrystal Crape R wrote: New ADHD medication that's not so strong, he become a be tired when he takes it and have to snack more when taking venlafaxine  XR (EFFEXOR  XR) 37.5 MG 24 hr capsule

## 2023-12-28 MED ORDER — ATOMOXETINE HCL 25 MG PO CAPS: 25.0000 mg | ORAL_CAPSULE | Freq: Every day | ORAL | 1 refills | Status: DC

## 2023-12-29 ENCOUNTER — Ambulatory Visit (INDEPENDENT_AMBULATORY_CARE_PROVIDER_SITE_OTHER): Admitting: Family Medicine

## 2023-12-29 ENCOUNTER — Encounter: Payer: Self-pay | Admitting: Family Medicine

## 2023-12-29 VITALS — BP 131/78 | HR 72 | Resp 18 | Ht 70.0 in | Wt 211.2 lb

## 2023-12-29 DIAGNOSIS — J301 Allergic rhinitis due to pollen: Secondary | ICD-10-CM

## 2023-12-29 DIAGNOSIS — I10 Essential (primary) hypertension: Secondary | ICD-10-CM | POA: Diagnosis not present

## 2023-12-29 DIAGNOSIS — F988 Other specified behavioral and emotional disorders with onset usually occurring in childhood and adolescence: Secondary | ICD-10-CM | POA: Diagnosis not present

## 2023-12-29 DIAGNOSIS — J3089 Other allergic rhinitis: Secondary | ICD-10-CM | POA: Diagnosis not present

## 2023-12-29 DIAGNOSIS — G5602 Carpal tunnel syndrome, left upper limb: Secondary | ICD-10-CM

## 2023-12-29 MED ORDER — LOSARTAN POTASSIUM 25 MG PO TABS: 25.0000 mg | ORAL_TABLET | Freq: Every day | ORAL | 2 refills | Status: DC

## 2023-12-29 MED ORDER — ATOMOXETINE HCL 25 MG PO CAPS: 25.0000 mg | ORAL_CAPSULE | Freq: Every day | ORAL | 1 refills | Status: DC

## 2023-12-29 NOTE — Patient Instructions (Signed)
Carpal tunnel splint

## 2023-12-29 NOTE — Progress Notes (Signed)
 Established patient visit   Patient: Zachary Golden   DOB: 11/05/1985   38 y.o. Male  MRN: [id number] Visit Date: 12/29/2023  Today's healthcare provider: Carlean Charter, DO   Chief Complaint  Patient presents with   Hypertension   Referral    Wants to see if he can be referred to an allergist to be tested   Subjective    Hypertension   Zachary Golden is a 38 year old male with hypertension who presents for medication management and allergy evaluation.  He has been monitoring his blood pressure at home, with readings consistently around 122/80 mmHg, occasionally reaching 127/94 mmHg. He is currently taking hydrochlorothiazide  25 mg daily and losartan  25 mg daily, with a 90-day supply available. He missed one day of medication, but his blood pressure remained stable.  He recently discontinued venlafaxine  due to side effects such as lack of energy and feeling 'like a zombie,' despite regular eating. Although he noticed improved focus while on venlafaxine , the adverse effects led to its discontinuation. He is planning to start atomoxetine on his next day off to monitor for any side effects.  He has a history of seasonal allergies and suspects a dust allergy, as his mother is also allergic to dust. He experiences burning sensations when touching slightly dusty surfaces. He also suspects food allergies to green onions, bananas, and avocados, which cause oral discomfort described as 'little tiny spikes' in his mouth.  He reports numbness and pressure in his wrist, particularly on the palm side of his left hand. He experiences intermittent numbness in the entire wrist and palm, which he temporarily relieves by cracking his wrist. He has tried compression gloves without relief. No numbness in the thumb.      Medications: Outpatient Medications Prior to Visit  Medication Sig   hydrochlorothiazide  (HYDRODIURIL ) 25 MG tablet Take 1 tablet (25 mg total) by mouth daily.   [DISCONTINUED]  atomoxetine (STRATTERA) 25 MG capsule Take 1 capsule (25 mg total) by mouth daily.   [DISCONTINUED] losartan  (COZAAR ) 25 MG tablet Take 1 tablet (25 mg total) by mouth daily.   albuterol (VENTOLIN HFA) 108 (90 Base) MCG/ACT inhaler Inhale 2 puffs into the lungs every 6 (six) hours as needed for wheezing or shortness of breath.   No facility-administered medications prior to visit.        Objective    BP 131/78 (BP Location: Right Arm, Patient Position: Sitting, Cuff Size: Large)   Pulse 72   Resp 18   Ht 5\' 10"  (1.778 m)   Wt 211 lb 3.2 oz (95.8 kg)   SpO2 97%   BMI 30.30 kg/m     Physical Exam Vitals and nursing note reviewed.  Constitutional:      General: He is not in acute distress.    Appearance: Normal appearance.  HENT:     Head: Normocephalic and atraumatic.  Eyes:     General: No scleral icterus.    Conjunctiva/sclera: Conjunctivae normal.  Cardiovascular:     Rate and Rhythm: Normal rate.  Pulmonary:     Effort: Pulmonary effort is normal.  Neurological:     Mental Status: He is alert and oriented to person, place, and time. Mental status is at baseline.  Psychiatric:        Mood and Affect: Mood normal.        Behavior: Behavior normal.      No results found for any visits on 12/29/23.  Assessment & Plan  Hypertension, unspecified type -     Losartan  Potassium; Take 1 tablet (25 mg total) by mouth daily.  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  Dispense: 30 capsule; Refill: 1  Carpal tunnel syndrome of left wrist     Hypertension Controlled with hydrochlorothiazide  and losartan . Home readings average 122/80 mmHg. Adherent to regimen. - Continue hydrochlorothiazide  25 mg as prescribed. - Continue losartan  as  prescribed with refills sent to [facility]  Attention Deficit Hyperactivity Disorder (ADHD) Discontinued venlafaxine  due to side effects. Improved focus noted. Prefers atomoxetine. - Send atomoxetine prescription to [facility]. - Schedule follow-up in six weeks to assess atomoxetine effectiveness and consider dosage adjustment.  Carpal Tunnel Syndrome Left hand numbness and pressure. Conservative management recommended. - Recommend carpal tunnel splint during sleep. - Advise wrist stretching exercises. - Consider ergonomic adjustments for computer and other repetitive hand use.  Suspected Dust Allergy and Food Allergies Suspected dust allergy and oral allergy syndrome related to pollen allergies. - Refer to allergist for dust and food allergy testing.    Return in about 6 weeks (around 02/09/2024) for Recheck ADHD sx.      I discussed the assessment and treatment plan with the patient  The patient was provided an opportunity to ask questions and all were answered. The patient agreed with the plan and demonstrated an understanding of the instructions.   The patient was advised to call back or seek an in-person evaluation if the symptoms worsen or if the condition fails to improve as anticipated.    Carlean Charter, DO  [facility] [phone] (phone) [phone] (fax)  [facility]

## 2024-02-09 ENCOUNTER — Ambulatory Visit: Admitting: Family Medicine

## 2024-02-21 ENCOUNTER — Encounter: Payer: Self-pay | Admitting: Family Medicine

## 2024-02-21 ENCOUNTER — Ambulatory Visit (INDEPENDENT_AMBULATORY_CARE_PROVIDER_SITE_OTHER): Admitting: Family Medicine

## 2024-02-21 VITALS — BP 128/88 | HR 73 | Temp 98.4°F | Ht 70.0 in | Wt 252.0 lb

## 2024-02-21 DIAGNOSIS — F988 Other specified behavioral and emotional disorders with onset usually occurring in childhood and adolescence: Secondary | ICD-10-CM

## 2024-02-21 DIAGNOSIS — R7303 Prediabetes: Secondary | ICD-10-CM

## 2024-02-21 DIAGNOSIS — I10 Essential (primary) hypertension: Secondary | ICD-10-CM | POA: Diagnosis not present

## 2024-02-21 DIAGNOSIS — R944 Abnormal results of kidney function studies: Secondary | ICD-10-CM

## 2024-02-21 DIAGNOSIS — E782 Mixed hyperlipidemia: Secondary | ICD-10-CM

## 2024-02-21 DIAGNOSIS — R7401 Elevation of levels of liver transaminase levels: Secondary | ICD-10-CM

## 2024-02-21 MED ORDER — BLOOD GLUCOSE MONITORING SUPPL DEVI: 1.0000 | Freq: Every day | 0 refills | Status: AC

## 2024-02-21 MED ORDER — BLOOD GLUCOSE TEST VI STRP: 1.0000 | ORAL_STRIP | Freq: Every day | 3 refills | Status: AC

## 2024-02-21 MED ORDER — LANCETS MISC. MISC: 1.0000 | Freq: Every day | 3 refills | Status: AC

## 2024-02-21 MED ORDER — LANCET DEVICE MISC: 1.0000 | Freq: Every day | 0 refills | Status: AC

## 2024-02-21 MED ORDER — ATOMOXETINE HCL 25 MG PO CAPS: 25.0000 mg | ORAL_CAPSULE | Freq: Every day | ORAL | 1 refills | Status: AC

## 2024-02-21 NOTE — Progress Notes (Signed)
 Established patient visit   Patient: Zachary Golden   DOB: 08/30/1985   37 y.o. Male  MRN: [id number] Visit Date: 02/21/2024  Today's healthcare provider: LAURAINE LOISE BUOY, DO   Chief Complaint  Patient presents with   ADHD   Subjective    HPI Zachary Golden is a 38 year old male who presents for follow-up regarding ADHD and elevated blood glucose levels.  He is doing well on atomoxetine  25 mg, experiencing improved energy levels without significant issues. Occasionally, he has mild jitteriness or difficulty calming down, which he manages with a breathing app on his watch.  His A1c was previously found to be slightly elevated. He is open to using a glucose meter to monitor his blood sugar, especially after high-carb meals.  In terms of diet and exercise, he has been eating better, reducing fried foods, and increasing intake of pasta, seafood, and chicken. He reports eating lighter meals and has noticed weight loss, as evidenced by looser clothing. He is interested in speaking with a nutritionist to further address his diet.  He has not eaten today but usually tries to eat a banana or apple when hungry. He is working on eating in the morning but did not do so today. No issues with depression.      Medications: Outpatient Medications Prior to Visit  Medication Sig   albuterol (VENTOLIN HFA) 108 (90 Base) MCG/ACT inhaler Inhale 2 puffs into the lungs every 6 (six) hours as needed for wheezing or shortness of breath.   hydrochlorothiazide  (HYDRODIURIL ) 25 MG tablet Take 1 tablet (25 mg total) by mouth daily.   losartan  (COZAAR ) 25 MG tablet Take 1 tablet (25 mg total) by mouth daily.   [DISCONTINUED] atomoxetine  (STRATTERA ) 25 MG capsule Take 1 capsule (25 mg total) by mouth daily.   No facility-administered medications prior to visit.        Objective    BP 128/88 (BP Location: Right Arm, Patient Position: Sitting, Cuff Size: Large)   Pulse 73   Temp 98.4 F (36.9 C) (Oral)    Ht 5' 10 (1.778 m)   Wt 252 lb (114.3 kg)   SpO2 98%   BMI 36.16 kg/m     Physical Exam Vitals and nursing note reviewed.  Constitutional:      General: He is not in acute distress.    Appearance: Normal appearance.  HENT:     Head: Normocephalic and atraumatic.  Eyes:     General: No scleral icterus.    Conjunctiva/sclera: Conjunctivae normal.  Cardiovascular:     Rate and Rhythm: Normal rate.  Pulmonary:     Effort: Pulmonary effort is normal.  Neurological:     Mental Status: He is alert and oriented to person, place, and time. Mental status is at baseline.  Psychiatric:        Mood and Affect: Mood normal.        Behavior: Behavior normal.      No results found for any visits on 02/21/24.  Assessment & Plan    Attention deficit disorder (ADD) in adult -     Atomoxetine  HCl; Take 1 capsule (25 mg total) by mouth daily.  Dispense: 90 capsule; Refill: 1  Prediabetes -     Blood Glucose Monitoring Suppl; 1 each by Does not apply route daily before breakfast. May substitute to any manufacturer covered by patient's insurance.  Dispense: 1 each; Refill: 0 -     Blood Glucose Test; 1 each by In  Vitro route daily before breakfast. May substitute to any manufacturer covered by patient's insurance.  Dispense: 100 strip; Refill: 3 -     Lancet Device; 1 each by Does not apply route daily before breakfast. May substitute to any manufacturer covered by patient's insurance.  Dispense: 1 each; Refill: 0 -     Lancets Misc.; 1 each by Does not apply route daily before breakfast. May substitute to any manufacturer covered by patient's insurance.  Dispense: 100 each; Refill: 3 -     Amb Referral to Nutrition and Diabetic Education -     Hemoglobin A1c  Morbid obesity (HCC)  Primary hypertension -     Comprehensive metabolic panel with GFR  Decreased glomerular filtration rate (GFR) -     Comprehensive metabolic panel with GFR  Elevated ALT measurement -     Comprehensive  metabolic panel with GFR  Moderate mixed hyperlipidemia not requiring statin therapy -     Lipid panel      Attention-Deficit/Hyperactivity Disorder (ADHD) Symptoms improved with atomoxetine  25 mg. Mild jitteriness managed with a breathing app. Well-managed on current dose. - Continue atomoxetine  25 mg as prescribed.  Refilled today.  Hypertension Chronic, stable.  Well-managed on losartan  25 mg daily and hydrochlorothiazide  25 mg daily.  No changes.  Prediabetes A1c elevated. Agreed to monitor blood test levels at home to assess dietary impact.   Interested in dietary modifications. Reducing fried foods, increasing seafood and chicken intake. - Provide a glucose meter for home monitoring. - Educate on glucose meter use, including hand washing, drying (or using an alcohol swab and dry fully), and test strip use. - Refer to a nutritionist for dietary counseling. - Encourage continuation of dietary changes and exercise regimen.  Morbid obesity BMI 36.16 kg/m today, down from 38.67 kg/m  at his initial visit in April.  Congratulated patient on his weight loss.  Counseled patient on continued dietary modifications and exercise.  Decreased glomerular filtration rate Concern for possible chronic kidney disease.  Will recheck today.  If eGFR remains low, will check uACR at next visit.  General Health Maintenance Improving diet and exercise habits, resulting in weight loss and improved clothing fit. - Encourage continuation of healthy eating habits and exercise.     Return in about 3 months (around 05/23/2024) for Chronic f/u, ADHD, HTN, preDM.      I discussed the assessment and treatment plan with the patient  The patient was provided an opportunity to ask questions and all were answered. The patient agreed with the plan and demonstrated an understanding of the instructions.   The patient was advised to call back or seek an in-person evaluation if the symptoms worsen or if the  condition fails to improve as anticipated.    LAURAINE LOISE BUOY, DO  [facility] [phone] (phone) [phone] (fax)  [facility]

## 2024-02-22 LAB — LIPID PANEL
Chol/HDL Ratio: 4.8 ratio (ref 0.0–5.0)
Cholesterol, Total: 202 mg/dL — ABNORMAL HIGH (ref 100–199)
HDL: 42 mg/dL (ref >39)
LDL Chol Calc (NIH): 137 mg/dL — ABNORMAL HIGH (ref 0–99)
Triglycerides: 125 mg/dL (ref 0–149)
VLDL Cholesterol Cal: 23 mg/dL (ref 5–40)

## 2024-02-22 LAB — COMPREHENSIVE METABOLIC PANEL WITH GFR
ALT: 31 IU/L (ref 0–44)
AST: 22 IU/L (ref 0–40)
Albumin: 4.4 g/dL (ref 4.1–5.1)
Alkaline Phosphatase: 85 IU/L (ref 44–121)
BUN/Creatinine Ratio: 13 (ref 9–20)
BUN: 14 mg/dL (ref 6–20)
Bilirubin Total: 0.5 mg/dL (ref 0.0–1.2)
CO2: 18 mmol/L — ABNORMAL LOW (ref 20–29)
Calcium: 9.6 mg/dL (ref 8.7–10.2)
Chloride: 103 mmol/L (ref 96–106)
Creatinine, Ser: 1.11 mg/dL (ref 0.76–1.27)
Globulin, Total: 2.6 g/dL (ref 1.5–4.5)
Glucose: 101 mg/dL — ABNORMAL HIGH (ref 70–99)
Potassium: 4.5 mmol/L (ref 3.5–5.2)
Sodium: 143 mmol/L (ref 134–144)
Total Protein: 7 g/dL (ref 6.0–8.5)
eGFR: 89 mL/min/1.73 (ref >59)

## 2024-02-22 LAB — HEMOGLOBIN A1C
Est. average glucose Bld gHb Est-mCnc: 131 mg/dL
Hgb A1c MFr Bld: 6.2 % — ABNORMAL HIGH (ref 4.8–5.6)

## 2024-02-25 ENCOUNTER — Ambulatory Visit: Payer: Self-pay | Admitting: Family Medicine

## 2024-04-13 ENCOUNTER — Ambulatory Visit: Admitting: Dietician

## 2024-05-23 ENCOUNTER — Ambulatory Visit: Admitting: Dietician

## 2024-05-24 ENCOUNTER — Ambulatory Visit: Admitting: Family Medicine

## 2024-05-29 ENCOUNTER — Ambulatory Visit (INDEPENDENT_AMBULATORY_CARE_PROVIDER_SITE_OTHER): Admitting: Family Medicine

## 2024-05-29 ENCOUNTER — Encounter: Payer: Self-pay | Admitting: Family Medicine

## 2024-05-29 VITALS — BP 130/90 | HR 62 | Resp 16 | Ht 69.0 in | Wt 238.5 lb

## 2024-05-29 DIAGNOSIS — Z23 Encounter for immunization: Secondary | ICD-10-CM | POA: Diagnosis not present

## 2024-05-29 DIAGNOSIS — F988 Other specified behavioral and emotional disorders with onset usually occurring in childhood and adolescence: Secondary | ICD-10-CM | POA: Diagnosis not present

## 2024-05-29 DIAGNOSIS — I10 Essential (primary) hypertension: Secondary | ICD-10-CM | POA: Diagnosis not present

## 2024-05-29 DIAGNOSIS — R7303 Prediabetes: Secondary | ICD-10-CM | POA: Diagnosis not present

## 2024-05-29 DIAGNOSIS — J45990 Exercise induced bronchospasm: Secondary | ICD-10-CM

## 2024-05-29 DIAGNOSIS — Z8619 Personal history of other infectious and parasitic diseases: Secondary | ICD-10-CM

## 2024-05-29 MED ORDER — LOSARTAN POTASSIUM 50 MG PO TABS: 50.0000 mg | ORAL_TABLET | Freq: Every day | ORAL | 0 refills | Status: DC

## 2024-05-29 NOTE — Assessment & Plan Note (Addendum)
 Noted.  Forgetting to check blood sugars, concern for monitoring prediabetes. - Encouraged regular blood sugar monitoring. - Continue reduced carbohydrate diet

## 2024-05-29 NOTE — Assessment & Plan Note (Addendum)
 Chronic, stable. Symptoms managed with atomoxetine , improvement noted with consistent use. - Continue current atomoxetine  regimen. - Encouraged consistent medication adherence.

## 2024-05-29 NOTE — Assessment & Plan Note (Addendum)
 Morbid obesity with associated hypertension.  Lost approximately 20 pounds since last visit, indicating progress in weight management.  Congratulated patient on weight loss.  Encouraged continued lifestyle modifications.  No acute concerns.  Continue to monitor.

## 2024-05-29 NOTE — Progress Notes (Signed)
 Established patient visit   Patient: Zachary Golden   DOB: September 14, 1985   37 y.o. Male  MRN: [id number] Visit Date: 05/29/2024  Today's healthcare provider: LAURAINE LOISE BUOY, DO   Chief Complaint  Patient presents with   Medical Management of Chronic Issues    ADHD,HTN,preDM   Subjective    HPI Zachary Golden is a 38 year old male with ADHD, hypertension, and prediabetes who presents for follow-up care.  He has lost approximately 20 pounds since his last visit, attributing this to increased exercise and muscle mass gain. He used to be very active in the gym when he was younger.  He is currently taking atomoxetine  for ADHD and finds it effective as long as doses are not missed. He occasionally misses doses due to stress and forgetfulness, especially after a recent move. He has set a reminder on his phone to take his medication at 11 AM daily. He notices a significant difference in his ability to focus and remain still when he misses a dose.  For hypertension, he is on losartan  25 mg and hydrochlorothiazide  25 mg. He experiences occasional dizziness when standing up from a crouched position and reports improvement in shoulder and arm pain. He has not been consistently checking his blood sugars due to forgetfulness, which he attributes to adjusting to a new living environment.  He has a history of asthma, which has improved significantly since childhood, and he rarely needs to use his inhaler. He recalls needing it recently during a move due to increased physical activity.  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 He experienced genital warts, which he treated with an over-the-counter ointment, although he found the process painful. He reports occasional flare-ups of the warts.  He has had COVID-19 twice, with the first infection occurring early in the pandemic. He is uncertain about the need for a COVID booster at this point.      Medications: Outpatient  Medications Prior to Visit  Medication Sig   atomoxetine  (STRATTERA ) 25 MG capsule Take 1 capsule (25 mg total) by mouth daily.   Blood Glucose Monitoring Suppl DEVI 1 each by Does not apply route daily before breakfast. May substitute to any manufacturer covered by patient's insurance.   fluticasone (FLONASE) 50 MCG/ACT nasal spray 1-2 sprays each nostril Nasally once a day; Duration: 30 days   Glucose Blood (BLOOD GLUCOSE TEST STRIPS) STRP 1 each by In Vitro route daily before breakfast. May substitute to any manufacturer covered by patient's insurance.   hydrochlorothiazide  (HYDRODIURIL ) 25 MG tablet Take 1 tablet (25 mg total) by mouth daily.   Lancet Device MISC 1 each by Does not apply route daily before breakfast. May substitute to any manufacturer covered by patient's insurance.   Olopatadine HCl 0.2 % SOLN 1 drop into affected eye Ophthalmic Once a day; Duration: 30 days   [DISCONTINUED] losartan  (COZAAR ) 25 MG tablet Take 1 tablet (25 mg total) by mouth daily.   albuterol (VENTOLIN HFA) 108 (90 Base) MCG/ACT inhaler Inhale 2 puffs into the lungs every 6 (six) hours as needed for wheezing or shortness of breath.   No facility-administered medications prior to visit.        Objective    BP (!) 130/90 (BP Location: Left Arm, Patient Position: Sitting, Cuff Size: Large)   Pulse 62   Resp 16   Ht 5' 9 (1.753 m)   Wt 238 lb 8 oz (108.2 kg)   BMI 35.22  kg/m     Physical Exam Vitals and nursing note reviewed.  Constitutional:      General: He is not in acute distress.    Appearance: Normal appearance.  HENT:     Head: Normocephalic and atraumatic.  Eyes:     General: No scleral icterus.    Conjunctiva/sclera: Conjunctivae normal.  Cardiovascular:     Rate and Rhythm: Normal rate.  Pulmonary:     Effort: Pulmonary effort is normal.  Neurological:     Mental Status: He is alert and oriented to person, place, and time. Mental status is at baseline.  Psychiatric:         Mood and Affect: Mood normal.        Behavior: Behavior normal.      No results found for any visits on 05/29/24.  Assessment & Plan    Primary hypertension Assessment & Plan: Blood pressure control suboptimal, necessitating increased losartan  dosage. Occasional dizziness reported. - Increased losartan  to 50 mg daily. - Instructed to monitor for side effects such as dizziness or lightheadedness. - Consider combining losartan  and hydrochlorothiazide  into a single pill if well-tolerated.   Orders: -     Losartan  Potassium; Take 1 tablet (50 mg total) by mouth daily.  Dispense: 90 tablet; Refill: 0  Morbid obesity (HCC) Assessment & Plan: Morbid obesity with associated hypertension.  Lost approximately 20 pounds since last visit, indicating progress in weight management.  Congratulated patient on weight loss.  Encouraged continued lifestyle modifications.  No acute concerns.  Continue to monitor.     Attention deficit disorder (ADD) in adult Assessment & Plan: Chronic, stable. Symptoms managed with atomoxetine , improvement noted with consistent use. - Continue current atomoxetine  regimen. - Encouraged consistent medication adherence.   Prediabetes Assessment & Plan: Noted.  Forgetting to check blood sugars, concern for monitoring prediabetes. - Encouraged regular blood sugar monitoring. - Continue reduced carbohydrate diet    Exercise-induced asthma Assessment & Plan: Well-controlled with infrequent use of rescue inhaler. No acute concerns.  Continue to monitor.    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  Patient considering HPV vaccine. Discussed  potential side effects. - Deferred HPV vaccination to next visit per patient preference. - Discussed potential treatment options for future genital warts, including over-the-counter and prescription options.  General Health Maintenance Has not received COVID booster, hepatitis B vaccine, or HPV vaccine. Discussed benefits and potential side effects. - Deferred COVID booster to be received at the pharmacy. - Deferred hepatitis B and HPV vaccines to next visit. - Flu vaccine administered today. - Discussed potential side effects of vaccines, including flu-like symptoms.    Return in about 3 months (around 08/29/2024) for HTN, Chronic f/u.      I discussed the assessment and treatment plan with the patient  The patient was provided an opportunity to ask questions and all were answered. The patient agreed with the plan and demonstrated an understanding of the instructions.   The patient was advised to call back or seek an in-person evaluation if the symptoms worsen or if the condition fails to improve as anticipated.    LAURAINE LOISE BUOY, DO  [facility] [phone] (phone)  [phone] (fax)  [facility]

## 2024-06-05 NOTE — Assessment & Plan Note (Signed)
 Well-controlled with infrequent use of rescue inhaler. No acute concerns.  Continue to monitor.

## 2024-06-05 NOTE — Assessment & Plan Note (Signed)
 Blood pressure control suboptimal, necessitating increased losartan  dosage. Occasional dizziness reported. - Increased losartan  to 50 mg daily. - Instructed to monitor for side effects such as dizziness or lightheadedness. - Consider combining losartan  and hydrochlorothiazide  into a single pill if well-tolerated.

## 2024-06-13 ENCOUNTER — Other Ambulatory Visit: Payer: Self-pay | Admitting: Family Medicine

## 2024-06-13 ENCOUNTER — Telehealth: Payer: Self-pay

## 2024-06-13 DIAGNOSIS — I10 Essential (primary) hypertension: Secondary | ICD-10-CM

## 2024-06-13 NOTE — Telephone Encounter (Unsigned)
 Copied from CRM [id number]. Topic: Clinical - Medication Refill >> Jun 13, 2024  1:12 PM Tiffini S wrote: Medication:  hydrochlorothiazide  (HYDRODIURIL ) 25 MG tablet  Has the patient contacted their pharmacy? Yes (Agent: If no, request that the patient contact the pharmacy for the refill. If patient does not wish to contact the pharmacy document the reason why and proceed with request.) (Agent: If yes, when and what did the pharmacy advise?)  This is the patient's preferred pharmacy:  [facility] [address] [place] - [address] [place] [address] Phone: [phone] Fax: [phone]  Is this the correct pharmacy for this prescription? Yes If no, delete pharmacy and type the correct one.   Has the prescription been filled recently? Yes  Is the patient out of the medication? Yes  Has the patient been seen for an appointment in the last year OR does the patient have an upcoming appointment? Yes  Can we respond through MyChart? Yes  Agent: Please be advised that Rx refills may take up to 3 business days. We ask that you follow-up with your pharmacy.

## 2024-06-13 NOTE — Telephone Encounter (Signed)
 Copied from CRM #[id number]. Topic: Clinical - Medication Question >> Jun 13, 2024  2:26 PM Yolanda T wrote: Reason for CRM: patient called stated he forgot he had already picked up his medication last week and need to cancel the med refill request for hydrochlorothiazide  (HYDRODIURIL ) 25 MG tablet

## 2024-06-14 MED ORDER — HYDROCHLOROTHIAZIDE 25 MG PO TABS: 25.0000 mg | ORAL_TABLET | Freq: Every day | ORAL | 3 refills | Status: AC

## 2024-06-28 ENCOUNTER — Encounter: Payer: Self-pay | Admitting: Dietician

## 2024-06-28 ENCOUNTER — Encounter: Admitting: Dietician

## 2024-06-28 VITALS — Ht 69.0 in | Wt 241.9 lb

## 2024-06-28 DIAGNOSIS — E669 Obesity, unspecified: Secondary | ICD-10-CM | POA: Diagnosis not present

## 2024-06-28 DIAGNOSIS — Z6835 Body mass index (BMI) 35.0-35.9, adult: Secondary | ICD-10-CM | POA: Diagnosis not present

## 2024-06-28 DIAGNOSIS — Z713 Dietary counseling and surveillance: Secondary | ICD-10-CM | POA: Diagnosis not present

## 2024-06-28 DIAGNOSIS — R7303 Prediabetes: Secondary | ICD-10-CM | POA: Insufficient documentation

## 2024-06-28 NOTE — Progress Notes (Signed)
 Medical Nutrition Therapy: Visit start time: 1330  end time: 1430  Assessment:   Referral Diagnosis: prediabetes Other medical history/ diagnoses: HTN, obesity Psychosocial issues/ stress concerns: none  Medications, supplements: reconciled list in medical record   Current weight: 241.9lbs Height: 5'9 BMI: 35.72   Progress and evaluation:  Patient reports eating smaller portions, more structured eating pattern in effort to lose weight. 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 Dining out frequency: 0-1 meals per week. Who plans meals/ buys groceries? Self, brothers, mom Who prepares meals? Self, brothers, mom  Breakfast: fruit -- banana or apple + yogurt Snack: none or same as pm Lunch: usually skips, gets busy working and does not want to stop until  Snack: bag chips; quick snack (s) Supper: meat + veg + potatoes/ starch Snack: sometimes sandwich or cereal or leftovers from dinner Beverages: water; hot tea decaf; occ juice  Physical activity: sporadic squats, crunches, leg lifts, flutter kicks, planks, pushups   Intervention:   Nutrition Care Education:   Basic nutrition: basic food groups; appropriate nutrient balance; appropriate meal and snack schedule; general nutrition guidelines    Weight control: importance of low sugar and low fat choices; portion control; role of physical activity; role of stress and sleep Advanced nutrition:  food label reading prediabetes:  goals for BGs; appropriate meal and snack schedule; appropriate carb intake and balance, healthy carb choices; role of fiber, protein, fat; physical activity; effects of stress   Other intervention  notes: Patient has been working on positive lifestyle changes to reduce health risk; he is motivated to continue. Established additional goals for change with direction from patient   Nutritional Diagnosis:  [place]-2.1 Inpaired nutrition utilization and [place]-2.2 Altered nutrition-related laboratory As related to prediabetes.  As evidenced by elevated HbA1C. -3.3 Overweight/obesity As related to history of excess calories and inadequate physical activity.  As evidenced by patient with current BMI of 35.72.   Education Materials given:  [employer] guidelines for Diabetes Plate Planner with food lists, sample meal pattern Snacking handout Visit summary with goals/ instructions to be viewed via patient portal   Learner/ who was taught:  Patient   Level of understanding: Verbalizes/ demonstrates competency  Demonstrated degree of understanding via:   Teach back Learning barriers: None  Willingness to learn/ readiness for change: Eager, change in progress  Monitoring and Evaluation:  Dietary intake, exercise, glucose control, and body weight      follow up: 08/09/24

## 2024-06-28 NOTE — Patient Instructions (Signed)
 Plan to eat a meal or snack every 3-5 hours during the day. Avoid going longer than 5 hours without anything to eat.  Include a protein food + one other food group for snacks. For example, fruit + nuts, yogurt + fruit, cheese + crackers  Choose some whole grain foods, and foods high in fiber to help with blood sugar control and hunger control. Incorporate exercise most days of the week for 30 minutes.

## 2024-08-14 ENCOUNTER — Encounter: Admitting: Dietician

## 2024-08-22 ENCOUNTER — Other Ambulatory Visit: Payer: Self-pay | Admitting: Family Medicine

## 2024-08-22 DIAGNOSIS — I10 Essential (primary) hypertension: Secondary | ICD-10-CM

## 2024-08-23 ENCOUNTER — Encounter: Admitting: Dietician

## 2024-08-29 ENCOUNTER — Ambulatory Visit: Admitting: Family Medicine

## 2024-09-04 ENCOUNTER — Encounter: Admitting: Dietician
